# Patient Record
Sex: Female | Born: 1990 | Race: White | Hispanic: No | Marital: Single | Smoking: Never smoker
Health system: Southern US, Community
[De-identification: ages and names within clinical notes are randomized; demographics above are authoritative.]

## PROBLEM LIST (undated history)

## (undated) ENCOUNTER — Inpatient Hospital Stay (HOSPITAL_COMMUNITY): Payer: Self-pay

## (undated) DIAGNOSIS — J45909 Unspecified asthma, uncomplicated: Secondary | ICD-10-CM

## (undated) HISTORY — PX: PATENT DUCTUS ARTERIOUS REPAIR: SHX269

---
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
## 2015-04-16 LAB — OB RESULTS CONSOLE HIV ANTIBODY (ROUTINE TESTING): HIV: NONREACTIVE

## 2015-04-16 LAB — OB RESULTS CONSOLE HEPATITIS B SURFACE ANTIGEN: HEP B S AG: NEGATIVE

## 2015-04-16 LAB — OB RESULTS CONSOLE ANTIBODY SCREEN: Antibody Screen: NEGATIVE

## 2015-04-16 LAB — OB RESULTS CONSOLE GC/CHLAMYDIA
Chlamydia: NEGATIVE
Gonorrhea: NEGATIVE

## 2015-04-16 LAB — OB RESULTS CONSOLE RPR: RPR: NONREACTIVE

## 2015-04-16 LAB — OB RESULTS CONSOLE ABO/RH: RH TYPE: NEGATIVE

## 2015-04-16 LAB — OB RESULTS CONSOLE RUBELLA ANTIBODY, IGM: Rubella: IMMUNE

## 2015-05-30 ENCOUNTER — Other Ambulatory Visit (HOSPITAL_COMMUNITY): Payer: Self-pay | Admitting: Specialist

## 2015-05-30 DIAGNOSIS — O280 Abnormal hematological finding on antenatal screening of mother: Secondary | ICD-10-CM

## 2015-06-04 ENCOUNTER — Other Ambulatory Visit (HOSPITAL_COMMUNITY): Payer: Self-pay | Admitting: Specialist

## 2015-06-04 DIAGNOSIS — O280 Abnormal hematological finding on antenatal screening of mother: Secondary | ICD-10-CM

## 2015-06-05 ENCOUNTER — Other Ambulatory Visit (HOSPITAL_COMMUNITY): Payer: Self-pay | Admitting: Specialist

## 2015-06-05 ENCOUNTER — Ambulatory Visit (HOSPITAL_COMMUNITY)
Admission: RE | Admit: 2015-06-05 | Discharge: 2015-06-05 | Disposition: A | Discharge status: Home / Self Care | Payer: Medicaid Other | Source: Ambulatory Visit | Attending: Specialist | Admitting: Specialist

## 2015-06-05 ENCOUNTER — Encounter (HOSPITAL_COMMUNITY): Payer: Self-pay

## 2015-06-05 DIAGNOSIS — Z3A16 16 weeks gestation of pregnancy: Secondary | ICD-10-CM | POA: Insufficient documentation

## 2015-06-05 DIAGNOSIS — Z315 Encounter for genetic counseling: Secondary | ICD-10-CM | POA: Diagnosis present

## 2015-06-05 DIAGNOSIS — O283 Abnormal ultrasonic finding on antenatal screening of mother: Secondary | ICD-10-CM | POA: Insufficient documentation

## 2015-06-05 DIAGNOSIS — O280 Abnormal hematological finding on antenatal screening of mother: Secondary | ICD-10-CM | POA: Insufficient documentation

## 2015-06-05 NOTE — Progress Notes (Signed)
Genetic Counseling  High-Risk Gestation Note  Appointment Date:  06/05/2015 Referred By: Alm Bustardorn, Henry H, MD Date of Birth:  [birth year]   Pregnancy History: G1P0 Estimated Date of Delivery: 11/20/15 Estimated Gestational Age: 7549w0d Attending: Particia NearingMartha Decker, MD   Ms. Marcha SoldersBeverly R Territo was seen for consultation for genetic counseling because of an elevated MSAFP of 2.71 MoMs based on maternal serum screening through [facility].    In summary:   Elevated MSAFP (2.71 MoM) associated with 1 in 157 open spina bifida risk  Discussed various explanations for elevated MSAFP  Limited ultrasound today visualized normal early fetal anatomy  Follow-up ultrasound scheduled for 06/20/15; offer follow-up ultrasound in third trimester  Patient declined amniocentesis  We reviewed Ms. Favia's maternal serum screening result, the elevation of MSAFP, and the associated 1 in 157 risk for a fetal open neural tube defect.   We reviewed ONTDs, the typical multifactorial etiology, and variable prognosis.  In addition, we discussed additional explanations for an elevated MSAFP including: normal variation, twins, feto-maternal bleeding, a gestational dating error, abdominal wall defects, kidney differences, oligohydramnios, and placental problems.  We discussed that an unexplained elevation of MSAFP is associated with an increased risk for third trimester complications including: prematurity, low birth weight, and pre-eclampsia.    Ultrasound was performed at the time of today's visit. Visualized fetal anatomy appeared normal, but ultrasound was limited by early gestational age. Complete ultrasound results reported separately. Follow-up ultrasound is scheduled for 06/20/15 to complete fetal anatomic survey. She understands that ultrasound cannot rule out all birth defects or genetic syndromes.  However, she was counseled that 80-90% of fetuses with ONTDs can be detected by detailed 2nd trimester ultrasound,  when well visualized.  We reviewed additional available testing option of amniocentesis.  We discussed the risks, limitations, and benefits. We specifically discussed the associated 1 in 300-500 risk for complications from amniocentesis.  After thoughtful consideration of these options, Ms. Renae GlossShelton declined amniocentesis.       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 She reported that she was born full term or possibly a couple-few weeks early and had surgical correction at 9 months at [facility]. She is otherwise healthy. Congenital heart defects occur in approximately 1% of pregnancies.  Congenital heart defects may occur due to multifactorial influences, chromosomal abnormalities, genetic syndromes or environmental exposures.  Isolated heart defects are generally multifactorial.  Given the reported family history and assuming multifactorial inheritance, the risk for a congenital heart defect in the current pregnancy would be approximately 5-6%. We discussed the options of detailed ultrasound and fetal echocardiogram. The patient understands that prenatal ultrasound would not diagnose or rule out all congenital heart defects, including PDA during a pregnancy. Without further information regarding the provided family history, an accurate genetic risk cannot be calculated. Further genetic counseling is warranted if more information is obtained.  Ms. Renae GlossShelton denied exposure to environmental toxins or chemical agents. She denied the use of alcohol, tobacco or street drugs. She denied significant viral illnesses during the course of her pregnancy. 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   I counseled Ms. Marcha SoldersBeverly R Perrelli for approximately 35 minutes regarding the above risks and available options.    Quinn PlowmanKaren  Equilla Que, MS,  Certified Genetic Counselor 06/05/2015

## 2015-06-12 ENCOUNTER — Other Ambulatory Visit (HOSPITAL_COMMUNITY): Payer: Self-pay | Admitting: Specialist

## 2015-06-20 ENCOUNTER — Other Ambulatory Visit (HOSPITAL_COMMUNITY): Payer: Self-pay | Admitting: Specialist

## 2015-06-20 ENCOUNTER — Encounter (HOSPITAL_COMMUNITY): Payer: Self-pay

## 2015-06-20 ENCOUNTER — Ambulatory Visit (HOSPITAL_COMMUNITY)
Admission: RE | Admit: 2015-06-20 | Discharge: 2015-06-20 | Disposition: A | Discharge status: Home / Self Care | Payer: Medicaid Other | Source: Ambulatory Visit | Attending: Specialist | Admitting: Specialist

## 2015-06-20 DIAGNOSIS — O352XX0 Maternal care for (suspected) hereditary disease in fetus, not applicable or unspecified: Secondary | ICD-10-CM | POA: Diagnosis not present

## 2015-06-20 DIAGNOSIS — Z36 Encounter for antenatal screening of mother: Secondary | ICD-10-CM | POA: Diagnosis not present

## 2015-06-20 DIAGNOSIS — Z0375 Encounter for suspected cervical shortening ruled out: Secondary | ICD-10-CM | POA: Insufficient documentation

## 2015-06-20 DIAGNOSIS — O280 Abnormal hematological finding on antenatal screening of mother: Secondary | ICD-10-CM

## 2015-06-20 DIAGNOSIS — O283 Abnormal ultrasonic finding on antenatal screening of mother: Secondary | ICD-10-CM | POA: Insufficient documentation

## 2015-06-20 DIAGNOSIS — Z3689 Encounter for other specified antenatal screening: Secondary | ICD-10-CM | POA: Insufficient documentation

## 2015-06-20 DIAGNOSIS — Z3A18 18 weeks gestation of pregnancy: Secondary | ICD-10-CM | POA: Diagnosis not present

## 2015-08-26 ENCOUNTER — Emergency Department (HOSPITAL_COMMUNITY)
Admission: EM | Admit: 2015-08-26 | Discharge: 2015-08-26 | Disposition: A | Discharge status: Home / Self Care | Payer: Medicaid Other | Attending: Emergency Medicine | Admitting: Emergency Medicine

## 2015-08-26 ENCOUNTER — Encounter (HOSPITAL_COMMUNITY): Payer: Self-pay | Admitting: *Deleted

## 2015-08-26 DIAGNOSIS — B86 Scabies: Secondary | ICD-10-CM

## 2015-08-26 DIAGNOSIS — O98812 Other maternal infectious and parasitic diseases complicating pregnancy, second trimester: Secondary | ICD-10-CM | POA: Diagnosis not present

## 2015-08-26 DIAGNOSIS — O9A212 Injury, poisoning and certain other consequences of external causes complicating pregnancy, second trimester: Secondary | ICD-10-CM | POA: Diagnosis not present

## 2015-08-26 DIAGNOSIS — T148 Other injury of unspecified body region: Secondary | ICD-10-CM | POA: Insufficient documentation

## 2015-08-26 DIAGNOSIS — O9989 Other specified diseases and conditions complicating pregnancy, childbirth and the puerperium: Secondary | ICD-10-CM | POA: Diagnosis present

## 2015-08-26 DIAGNOSIS — Z3A28 28 weeks gestation of pregnancy: Secondary | ICD-10-CM | POA: Diagnosis not present

## 2015-08-26 MED ORDER — PERMETHRIN 5 % EX CREA: TOPICAL_CREAM | CUTANEOUS | Status: AC

## 2015-08-26 NOTE — Discharge Instructions (Signed)

## 2015-08-26 NOTE — ED Notes (Signed)
Pt here with itching rash which has been getting worse.  Significant other has same symptoms.  Pt is 7months pregnant

## 2015-08-26 NOTE — ED Provider Notes (Signed)
CSN: [id number]     Arrival date & time 08/26/15  1551 History  This chart was scribed for non-physician provider Roxy Horseman, PA-C, working with Laurence Spates, MD by Phillis Haggis, ED Scribe. This patient was seen in room [facility]WT/[facility] and patient care was started at 5:10 PM.   Chief Complaint  Patient presents with  . Rash   The history is provided by the patient. No language interpreter was used.   HPI Comments: Sierra Bailey is a 24 y.o. female who is 7 months pregnant who presents to the Emergency Department complaining of generalized itching rash onset 2 months ago. Pt states that the rash has recently appeared over her hands and wrists, but have been all over her stomach, buttocks, back and legs. She states that she has been using Gold Bond Powder on the areas to no relief. She and her boyfriend have previously been living with a lot of other people but have since moved to their own place. She denies any other symptoms.   History reviewed. No pertinent past medical history. History reviewed. No pertinent past surgical history. No family history on file. Social History  Substance Use Topics  . Smoking status: Never Smoker   . Smokeless tobacco: None  . Alcohol Use: No   OB History    Gravida Para Term Preterm AB TAB SAB Ectopic Multiple Living   1              Review of Systems  Constitutional: Negative for fever and chills.  Skin: Positive for rash. Negative for color change.   Allergies  Hemp seed oil  Home Medications   Prior to Admission medications   Medication Sig Start Date End Date Taking? Authorizing Provider  Prenatal Vit-Fe Fumarate-FA (PRENATAL MULTIVITAMIN) TABS tablet Take 1 tablet by mouth daily at 12 noon.   Yes Historical Provider, MD  AMOXICILLIN PO Take by mouth.    Historical Provider, MD   BP 108/72 mmHg  Pulse 95  Temp(Src) 98.6 F (37 C) (Oral)  Resp 14  Ht 5\' 10"  (1.778 m)  Wt 163 lb (73.936 kg)  BMI 23.39 kg/m2  SpO2 97%  LMP  02/13/2015  Physical Exam  Constitutional: She is oriented to person, place, and time. She appears well-developed and well-nourished. No distress.  HENT:  Head: Normocephalic and atraumatic.  Mouth/Throat: Oropharynx is clear and moist.  Eyes: Conjunctivae and EOM are normal.  Neck: Normal range of motion. Neck supple.  Cardiovascular: Normal rate, regular rhythm and normal heart sounds.   Pulmonary/Chest: Effort normal and breath sounds normal. No respiratory distress.  Musculoskeletal: Normal range of motion. She exhibits no edema.  Neurological: She is alert and oriented to person, place, and time. No sensory deficit.  Skin: Skin is warm and dry.  Scattered bug bites and excoriations consistent with scabies  Psychiatric: She has a normal mood and affect. Her behavior is normal.  Nursing note and vitals reviewed.   ED Course  Procedures (including critical care time) DIAGNOSTIC STUDIES: Oxygen Saturation is 97% on RA, normal by my interpretation.    COORDINATION OF CARE: 5:10 PM-Discussed treatment plan which includes Elimite cream with pt at bedside and pt agreed to plan.   Labs Review Labs Reviewed - No data to display  Imaging Review No results found.    EKG Interpretation None      MDM   Final diagnoses:  Scabies   Patient with scabies, will treat with permethrin, (pregnancy category B), return cautions given.  Patient is stable and ready for discharge. I personally performed the services described in this documentation, which was scribed in my presence. The recorded information has been reviewed and is accurate.    Roxy Horseman, PA-C 08/26/15 1727  Laurence Spates, MD 08/26/15 [phone]

## 2015-08-26 NOTE — ED Notes (Signed)
Spoke with [employer], he will see pt in triage

## 2015-11-11 ENCOUNTER — Encounter (HOSPITAL_COMMUNITY): Payer: Self-pay | Admitting: *Deleted

## 2015-11-11 ENCOUNTER — Inpatient Hospital Stay (HOSPITAL_COMMUNITY)
Admission: AD | Admit: 2015-11-11 | Discharge: 2015-11-11 | Disposition: A | Discharge status: Home / Self Care | Payer: Medicaid Other | Source: Ambulatory Visit | Attending: Family Medicine | Admitting: Family Medicine

## 2015-11-11 DIAGNOSIS — Z3A38 38 weeks gestation of pregnancy: Secondary | ICD-10-CM | POA: Diagnosis not present

## 2015-11-11 DIAGNOSIS — O368130 Decreased fetal movements, third trimester, not applicable or unspecified: Secondary | ICD-10-CM | POA: Diagnosis present

## 2015-11-11 HISTORY — DX: Unspecified asthma, uncomplicated: J45.909

## 2015-11-11 NOTE — Discharge Instructions (Signed)
Call your provider or seek care if:  You begin to have strong, frequent contractions  Your water breaks.  Sometimes it is a big gush of fluid, sometimes it is just a trickle that keeps getting your panties wet or running down your legs  You have vaginal bleeding.  It is normal to have a small amount of spotting if your cervix was checked.   You don't feel your baby moving like normal.  If you don't, get you something to eat and drink and lay down and focus on feeling your baby move.  You should feel at least 10 movements in 2 hours.  If you don't, you should call your provider or seek care.    Fetal Movement Counts Patient Name: __________________________________________________ Patient Due Date: ____________________ Performing a fetal movement count is highly recommended in high-risk pregnancies, but it is good for every pregnant woman to do. Your health care provider may ask you to start counting fetal movements at 28 weeks of the pregnancy. Fetal movements often increase: 6. After eating a full meal. 7. After physical activity. 8. After eating or drinking something sweet or cold. 9. At rest. Pay attention to when you feel the baby is most active. This will help you notice a pattern of your baby's sleep and wake cycles and what factors contribute to an increase in fetal movement. It is important to perform a fetal movement count at the same time each day when your baby is normally most active.  HOW TO COUNT FETAL MOVEMENTS 1. Find a quiet and comfortable area to sit or lie down on your left side. Lying on your left side provides the best blood and oxygen circulation to your baby. 2. Write down the day and time on a sheet of paper or in a journal. 3. Start counting kicks, flutters, swishes, rolls, or jabs in a 2-hour period. You should feel at least 10 movements within 2 hours. 4. If you do not feel 10 movements in 2 hours, wait 2-3 hours and count again. Look for a change in the pattern or  not enough counts in 2 hours. SEEK MEDICAL CARE IF:  You feel less than 10 counts in 2 hours, tried twice.  There is no movement in over an hour.  The pattern is changing or taking longer each day to reach 10 counts in 2 hours.  You feel the baby is not moving as he or she usually does. Date: ____________ Movements: ____________ Start time: ____________ Doreatha MartinFinish time: ____________  Date: ____________ Movements: ____________ Start time: ____________ Doreatha MartinFinish time: ____________ Date: ____________ Movements: ____________ Start time: ____________ Doreatha MartinFinish time: ____________ Date: ____________ Movements: ____________ Start time: ____________ Doreatha MartinFinish time: ____________ Date: ____________ Movements: ____________ Start time: ____________ Doreatha MartinFinish time: ____________ Date: ____________ Movements: ____________ Start time: ____________ Doreatha MartinFinish time: ____________ Date: ____________ Movements: ____________ Start time: ____________ Doreatha MartinFinish time: ____________ Date: ____________ Movements: ____________ Start time: ____________ Doreatha MartinFinish time: ____________  Date: ____________ Movements: ____________ Start time: ____________ Doreatha MartinFinish time: ____________ Date: ____________ Movements: ____________ Start time: ____________ Doreatha MartinFinish time: ____________ Date: ____________ Movements: ____________ Start time: ____________ Doreatha MartinFinish time: ____________ Date: ____________ Movements: ____________ Start time: ____________ Doreatha MartinFinish time: ____________ Date: ____________ Movements: ____________ Start time: ____________ Doreatha MartinFinish time: ____________ Date: ____________ Movements: ____________ Start time: ____________ Doreatha MartinFinish time: ____________ Date: ____________ Movements: ____________ Start time: ____________ Doreatha MartinFinish time: ____________  Date: ____________ Movements: ____________ Start time: ____________ Doreatha MartinFinish time: ____________ Date: ____________ Movements: ____________ Start time: ____________ Doreatha MartinFinish time: ____________ Date: ____________ Movements:  ____________ Start time: ____________  Finish time: ____________ Date: ____________ Movements: ____________ Start time: ____________ Doreatha Martin time: ____________ Date: ____________ Movements: ____________ Start time: ____________ Doreatha Martin time: ____________ Date: ____________ Movements: ____________ Start time: ____________ Doreatha Martin time: ____________ Date: ____________ Movements: ____________ Start time: ____________ Doreatha Martin time: ____________  Date: ____________ Movements: ____________ Start time: ____________ Doreatha Martin time: ____________ Date: ____________ Movements: ____________ Start time: ____________ Doreatha Martin time: ____________ Date: ____________ Movements: ____________ Start time: ____________ Doreatha Martin time: ____________ Date: ____________ Movements: ____________ Start time: ____________ Doreatha Martin time: ____________ Date: ____________ Movements: ____________ Start time: ____________ Doreatha Martin time: ____________ Date: ____________ Movements: ____________ Start time: ____________ Doreatha Martin time: ____________ Date: ____________ Movements: ____________ Start time: ____________ Doreatha Martin time: ____________  Date: ____________ Movements: ____________ Start time: ____________ Doreatha Martin time: ____________ Date: ____________ Movements: ____________ Start time: ____________ Doreatha Martin time: ____________ Date: ____________ Movements: ____________ Start time: ____________ Doreatha Martin time: ____________ Date: ____________ Movements: ____________ Start time: ____________ Doreatha Martin time: ____________ Date: ____________ Movements: ____________ Start time: ____________ Doreatha Martin time: ____________ Date: ____________ Movements: ____________ Start time: ____________ Doreatha Martin time: ____________ Date: ____________ Movements: ____________ Start time: ____________ Doreatha Martin time: ____________  Date: ____________ Movements: ____________ Start time: ____________ Doreatha Martin time: ____________ Date: ____________ Movements: ____________ Start time: ____________ Doreatha Martin  time: ____________ Date: ____________ Movements: ____________ Start time: ____________ Doreatha Martin time: ____________ Date: ____________ Movements: ____________ Start time: ____________ Doreatha Martin time: ____________ Date: ____________ Movements: ____________ Start time: ____________ Doreatha Martin time: ____________ Date: ____________ Movements: ____________ Start time: ____________ Doreatha Martin time: ____________ Date: ____________ Movements: ____________ Start time: ____________ Doreatha Martin time: ____________  Date: ____________ Movements: ____________ Start time: ____________ Doreatha Martin time: ____________ Date: ____________ Movements: ____________ Start time: ____________ Doreatha Martin time: ____________ Date: ____________ Movements: ____________ Start time: ____________ Doreatha Martin time: ____________ Date: ____________ Movements: ____________ Start time: ____________ Doreatha Martin time: ____________ Date: ____________ Movements: ____________ Start time: ____________ Doreatha Martin time: ____________ Date: ____________ Movements: ____________ Start time: ____________ Doreatha Martin time: ____________ Date: ____________ Movements: ____________ Start time: ____________ Doreatha Martin time: ____________  Date: ____________ Movements: ____________ Start time: ____________ Doreatha Martin time: ____________ Date: ____________ Movements: ____________ Start time: ____________ Doreatha Martin time: ____________ Date: ____________ Movements: ____________ Start time: ____________ Doreatha Martin time: ____________ Date: ____________ Movements: ____________ Start time: ____________ Doreatha Martin time: ____________ Date: ____________ Movements: ____________ Start time: ____________ Doreatha Martin time: ____________ Date: ____________ Movements: ____________ Start time: ____________ Doreatha Martin time: ____________   This information is not intended to replace advice given to you by your health care provider. Make sure you discuss any questions you have with your health care provider.   Document Released: 01/14/2007 Document  Revised: 01/05/2015 Document Reviewed: 10/11/2012 Elsevier Interactive Patient Education [employer]

## 2015-11-11 NOTE — MAU Note (Signed)
Pt stated she normally feels baby move all the time like every 5 min. Stated she felt a sharp pain in her pelvis and did not feel baaby move for about 30 min. Came strait to the hospital. ( pt gets care in [place] Dr. Shawnie Ponsorn). Denies SROM or Bleeding today. Had some white stringy mucus yesterday none today.

## 2015-11-11 NOTE — MAU Provider Note (Signed)
  History  Chief Complaint:  Decreased Fetal Movement  Sierra Bailey is a 24 y.o. G1P0 female at 6928w5d presenting w/ report of decreased fm x 30mins, states she usually feels baby move every 5 mins.  Reports she has felt normal movement since being on efm.   Reports decreased  fetal movement, contractions: none, vaginal bleeding: none, membranes: intact. Denies uti s/s, abnormal/malodorous vag d/c or vulvovaginal itching/irritation.   Prenatal care w/ Dr. Shawnie Ponsorn.  Next visit unsure- missed her visit on Friday and hasn't rescheduled yet. Pregnancy complicated by elevated AFP w/ OSBR 1:157.   Obstetrical History: OB History    Gravida Para Term Preterm AB TAB SAB Ectopic Multiple Living   1               Past Medical History: Past Medical History  Diagnosis Date  . Asthma     Past Surgical History: Past Surgical History  Procedure Laterality Date  . Patent ductus arterious repair      Social History: Social History   Social History  . Marital Status: Single    Spouse Name: N/A  . Number of Children: N/A  . Years of Education: N/A   Social History Main Topics  . Smoking status: Never Smoker   . Smokeless tobacco: None  . Alcohol Use: No  . Drug Use: No  . Sexual Activity: Not Asked   Other Topics Concern  . None   Social History Narrative    Allergies: Allergies  Allergen Reactions  . Hemp Seed Oil [Dronabinol]     Prescriptions prior to admission  Medication Sig Dispense Refill Last Dose  . AMOXICILLIN PO Take by mouth.     . permethrin (ELIMITE) 5 % cream Apply to entire body other than face - let sit for 14 hours then wash off, may repeat in 1 week if still having symptoms 60 g 1   . Prenatal Vit-Fe Fumarate-FA (PRENATAL MULTIVITAMIN) TABS tablet Take 1 tablet by mouth daily at 12 noon.   08/25/2015 at Unknown time    Review of Systems  Pertinent pos/neg as indicated in HPI  Physical Exam  Blood pressure 127/82, pulse 90, temperature 98 F (36.7 C),  temperature source Oral, resp. rate 18, height 5\' 10"  (1.778 m), weight 86.274 kg (190 lb 3.2 oz), last menstrual period 02/13/2015. General appearance: alert, cooperative and no distress Lungs: clear to auscultation bilaterally, normal effort Heart: regular rate and rhythm Abdomen: gravid, soft, non-tender  Spec exam: n/a Cultures/Specimens: n/a    Presentation: not examined  Fetal monitoring: FHR: 135 bpm, variability: moderate,  Accelerations: Present,  decelerations:  Absent Uterine activity: none  MAU Course  Reactive NST  Labs:  No results found for this or any previous visit (from the past 24 hour(s)).  Imaging:  n/a  Assessment and Plan  A:  2528w5d SIUP  G1P0  Decreased fm, now normal w/ reactive NST  Cat 1 FHR P:  D/C home  Reviewed labor s/s, fkc  Call Monday to get next appt scheduled w/ Dr. Shawnie Ponsorn  Recommended that she go to the hospital where her provider practices for any further complications   Marge DuncansBooker, Kimberly Randall CNM,WHNP-BC 11/13/201612:49 AM

## 2016-02-01 ENCOUNTER — Encounter (HOSPITAL_COMMUNITY): Payer: Self-pay

## 2016-02-01 ENCOUNTER — Emergency Department (HOSPITAL_COMMUNITY)
Admission: EM | Admit: 2016-02-01 | Discharge: 2016-02-01 | Disposition: A | Discharge status: Home / Self Care | Payer: Medicaid Other | Attending: Emergency Medicine | Admitting: Emergency Medicine

## 2016-02-01 ENCOUNTER — Emergency Department (HOSPITAL_COMMUNITY): Payer: Medicaid Other

## 2016-02-01 DIAGNOSIS — D72829 Elevated white blood cell count, unspecified: Secondary | ICD-10-CM | POA: Insufficient documentation

## 2016-02-01 DIAGNOSIS — N61 Mastitis without abscess: Secondary | ICD-10-CM | POA: Insufficient documentation

## 2016-02-01 DIAGNOSIS — R000 Tachycardia, unspecified: Secondary | ICD-10-CM | POA: Diagnosis not present

## 2016-02-01 DIAGNOSIS — J45909 Unspecified asthma, uncomplicated: Secondary | ICD-10-CM | POA: Insufficient documentation

## 2016-02-01 DIAGNOSIS — N644 Mastodynia: Secondary | ICD-10-CM | POA: Diagnosis present

## 2016-02-01 DIAGNOSIS — O9123 Nonpurulent mastitis associated with lactation: Secondary | ICD-10-CM

## 2016-02-01 LAB — COMPREHENSIVE METABOLIC PANEL
ALK PHOS: 72 U/L (ref 38–126)
ALT: 57 U/L — ABNORMAL HIGH (ref 14–54)
ANION GAP: 13 (ref 5–15)
AST: 36 U/L (ref 15–41)
Albumin: 4.2 g/dL (ref 3.5–5.0)
BILIRUBIN TOTAL: 0.6 mg/dL (ref 0.3–1.2)
BUN: 6 mg/dL (ref 6–20)
CALCIUM: 9.9 mg/dL (ref 8.9–10.3)
CO2: 24 mmol/L (ref 22–32)
CREATININE: 1.15 mg/dL — AB (ref 0.44–1.00)
Chloride: 103 mmol/L (ref 101–111)
Glucose, Bld: 114 mg/dL — ABNORMAL HIGH (ref 65–99)
Potassium: 4.2 mmol/L (ref 3.5–5.1)
Sodium: 140 mmol/L (ref 135–145)
TOTAL PROTEIN: 7.2 g/dL (ref 6.5–8.1)

## 2016-02-01 LAB — URINALYSIS, ROUTINE W REFLEX MICROSCOPIC
BILIRUBIN URINE: NEGATIVE
Glucose, UA: NEGATIVE mg/dL
Ketones, ur: NEGATIVE mg/dL
Leukocytes, UA: NEGATIVE
NITRITE: NEGATIVE
PH: 6.5 (ref 5.0–8.0)
Protein, ur: NEGATIVE mg/dL
Specific Gravity, Urine: 1.008 (ref 1.005–1.030)

## 2016-02-01 LAB — CBC WITH DIFFERENTIAL/PLATELET
Basophils Absolute: 0 10*3/uL (ref 0.0–0.1)
Basophils Relative: 0 %
Eosinophils Absolute: 0 10*3/uL (ref 0.0–0.7)
Eosinophils Relative: 0 %
HEMATOCRIT: 42.9 % (ref 36.0–46.0)
Hemoglobin: 15.1 g/dL — ABNORMAL HIGH (ref 12.0–15.0)
LYMPHS ABS: 1.2 10*3/uL (ref 0.7–4.0)
Lymphocytes Relative: 9 %
MCH: 31.6 pg (ref 26.0–34.0)
MCHC: 35.2 g/dL (ref 30.0–36.0)
MCV: 89.7 fL (ref 78.0–100.0)
MONOS PCT: 4 %
Monocytes Absolute: 0.5 10*3/uL (ref 0.1–1.0)
NEUTROS ABS: 11.4 10*3/uL — AB (ref 1.7–7.7)
Neutrophils Relative %: 87 %
Platelets: 161 10*3/uL (ref 150–400)
RBC: 4.78 MIL/uL (ref 3.87–5.11)
RDW: 13.1 % (ref 11.5–15.5)
WBC: 13.1 10*3/uL — AB (ref 4.0–10.5)

## 2016-02-01 LAB — URINE MICROSCOPIC-ADD ON: BACTERIA UA: NONE SEEN

## 2016-02-01 LAB — I-STAT CG4 LACTIC ACID, ED: Lactic Acid, Venous: 1.32 mmol/L (ref 0.5–2.0)

## 2016-02-01 MED ORDER — SULFAMETHOXAZOLE-TRIMETHOPRIM 800-160 MG PO TABS: 1.0000 | ORAL_TABLET | Freq: Two times a day (BID) | ORAL | Status: AC

## 2016-02-01 MED ORDER — ACETAMINOPHEN 325 MG PO TABS
ORAL_TABLET | ORAL | Status: AC
  Filled 2016-02-01: qty 3

## 2016-02-01 MED ORDER — SULFAMETHOXAZOLE-TRIMETHOPRIM 800-160 MG PO TABS
1.0000 | ORAL_TABLET | Freq: Once | ORAL | Status: AC
  Administered 2016-02-01: 1 via ORAL
  Filled 2016-02-01: qty 1

## 2016-02-01 MED ORDER — ACETAMINOPHEN 500 MG PO TABS
1000.0000 mg | ORAL_TABLET | Freq: Once | ORAL | Status: AC
  Administered 2016-02-01: 975 mg via ORAL

## 2016-02-01 NOTE — ED Provider Notes (Signed)
CSN: [id number]     Arrival date & time 02/01/16  1436 History   First MD Initiated Contact with Patient 02/01/16 1718     Chief Complaint  Patient presents with  . Breast Pain     (Consider location/radiation/quality/duration/timing/severity/associated sxs/prior Treatment) Patient is a 25 y.o. female presenting with general illness. The history is provided by the patient.  Illness Location:  Right breast pain  Quality:  Aching Severity:  Moderate Onset quality:  Gradual Duration:  3 days Timing:  Constant Progression:  Worsening Chronicity:  New Context:  Fever last night Relieved by:  Tylenol Worsened by:  Palpation Ineffective treatments:  N/a Associated symptoms: fever   Associated symptoms: no abdominal pain, no chest pain, no congestion, no cough, no diarrhea, no loss of consciousness, no nausea, no shortness of breath and no vomiting     Past Medical History  Diagnosis Date  . Asthma    Past Surgical History  Procedure Laterality Date  . Patent ductus arterious repair     History reviewed. No pertinent family history. Social History  Substance Use Topics  . Smoking status: Never Smoker   . Smokeless tobacco: None  . Alcohol Use: No   OB History    Gravida Para Term Preterm AB TAB SAB Ectopic Multiple Living   1              Review of Systems  Constitutional: Positive for fever.  HENT: Negative for congestion.   Eyes: Negative for visual disturbance.  Respiratory: Negative for cough and shortness of breath.   Cardiovascular: Negative for chest pain.  Gastrointestinal: Negative for nausea, vomiting, abdominal pain and diarrhea.  Genitourinary: Negative.   Musculoskeletal: Negative.   Skin: Negative.   Neurological: Negative.  Negative for loss of consciousness.      Allergies  Hemp seed oil  Home Medications   Prior to Admission medications   Medication Sig Start Date End Date Taking? Authorizing Provider  permethrin (ELIMITE) 5 % cream Apply  to entire body other than face - let sit for 14 hours then wash off, may repeat in 1 week if still having symptoms 08/26/15  Yes Roxy Horseman, PA-C  MIRENA, 52 MG, 20 MCG/24HR IUD TO BE INSERTED ONE TIME BY PRESCRIBER. ROUTE INTRAUTERINE. 01/02/16   Historical Provider, MD  sulfamethoxazole-trimethoprim (BACTRIM DS,SEPTRA DS) 800-160 MG tablet Take 1 tablet by mouth 2 (two) times daily. 02/01/16 02/08/16  Blane Ohara, MD   BP 95/61 mmHg  Pulse 95  Temp(Src) 99 F (37.2 C) (Oral)  Resp 16  SpO2 98%  LMP 01/21/2016 (Approximate)  Breastfeeding? Yes Physical Exam  Constitutional: She is oriented to person, place, and time. She appears well-developed and well-nourished. No distress.  HENT:  Head: Normocephalic and atraumatic.  Eyes: EOM are normal. Pupils are equal, round, and reactive to light. No scleral icterus.  Neck: Normal range of motion. Neck supple.  Cardiovascular: Regular rhythm, normal heart sounds and intact distal pulses.   tachycardia  Pulmonary/Chest: Effort normal and breath sounds normal. She exhibits tenderness (right breast) and swelling. She exhibits no laceration and no deformity. Right breast exhibits skin change (with some spreading erythema from nipple) and tenderness. Right breast exhibits no inverted nipple.  Abdominal: Soft. Bowel sounds are normal. She exhibits no distension. There is no tenderness. There is no rebound and no guarding.  Musculoskeletal: Normal range of motion. She exhibits no edema or tenderness.  Neurological: She is alert and oriented to person, place, and time. No cranial nerve  deficit. She exhibits normal muscle tone. Coordination normal.  Skin: Skin is warm. No rash noted. She is not diaphoretic. No erythema. No pallor.  Psychiatric: She has a normal mood and affect.  Nursing note and vitals reviewed.   ED Course  Procedures (including critical care time) Labs Review Labs Reviewed  COMPREHENSIVE METABOLIC PANEL - Abnormal; Notable for the  following:    Glucose, Bld 114 (*)    Creatinine, Ser 1.15 (*)    ALT 57 (*)    All other components within normal limits  URINALYSIS, ROUTINE W REFLEX MICROSCOPIC (NOT AT [facility]) - Abnormal; Notable for the following:    APPearance CLOUDY (*)    Hgb urine dipstick MODERATE (*)    All other components within normal limits  CBC WITH DIFFERENTIAL/PLATELET - Abnormal; Notable for the following:    WBC 13.1 (*)    Hemoglobin 15.1 (*)    Neutro Abs 11.4 (*)    All other components within normal limits  URINE MICROSCOPIC-ADD ON - Abnormal; Notable for the following:    Squamous Epithelial / LPF 0-5 (*)    All other components within normal limits  CULTURE, BLOOD (ROUTINE X 2)  CULTURE, BLOOD (ROUTINE X 2)  URINE CULTURE  I-STAT CG4 LACTIC ACID, ED    Imaging Review Dg Chest 2 View  02/01/2016  CLINICAL DATA:  Right breast swelling and redness for several days. fever headache. Mild asthma history. EXAM: CHEST  2 VIEW COMPARISON:  None available FINDINGS: Normal heart size and vascularity. Lungs remain clear. No focal pneumonia, collapse or consolidation. No edema, effusion or pneumothorax. Trachea midline. No soft tissue abnormality or acute osseous finding. Chronic diffuse deformity of the left posterior fifth rib. IMPRESSION: No active cardiopulmonary disease. Electronically Signed   By: Judie Petit.  Shick M.D.   On: 02/01/2016 15:37   I have personally reviewed and evaluated these images and lab results as part of my medical decision-making.   EKG Interpretation None      MDM   Final diagnoses:  Nonpurulent mastitis associated with lactation    Patient is a 25 year old female 2 months postpartum who presents with worsening right breast pain for 2-3 nights and last night developed a fever to 102. She has been breast-feeding regularly. Further history and exam as above notable for slight tachycardia and temperature 102.7. On breast exam her breast is swollen and tender with erythema. Heart rate  and temperature improved with treatment here. Patient has a mild leukocytosis but otherwise labs are reassuring. Concern for lactation mastitis at this time. Doubt significant systemic infection. Patient given a dose of Bactrim here with concerns of possible MRSA.   I have reviewed all labs. Patient stable for discharge home.  I have reviewed all results with the patient. Advised to cont to breast or pump and waste. Will rx bactrim. Patient agrees to stated plan. All questions answered. Advised to call or return to have any questions, new symptoms, change in symptoms, or symptoms that they do not understand.     Marijean Niemann, MD 02/02/16 1046  Blane Ohara, MD 02/02/16 [phone]

## 2016-02-01 NOTE — Discharge Instructions (Signed)
Take antibiotics as directed. Take Tylenol every 4 hours for pain and fever. Continue to either pump or breast-feed and follow closely with your OB doctor or primary doctor. If your symptoms worsen, uncontrolled fevers, pass out or other concerns please come to the emergency room or call your doctor.  If you were given medicines take as directed.  If you are on coumadin or contraceptives realize their levels and effectiveness is altered by many different medicines.  If you have any reaction (rash, tongues swelling, other) to the medicines stop taking and see a physician.    If your blood pressure was elevated in the ER make sure you follow up for management with a primary doctor or return for chest pain, shortness of breath or stroke symptoms.  Please follow up as directed and return to the ER or see a physician for new or worsening symptoms.  Thank you. Filed Vitals:   02/01/16 1749 02/01/16 1800 02/01/16 1807 02/01/16 1832  BP:  107/68 107/68 95/61  Pulse:  102 99 95  Temp: 99 F (37.2 C)     TempSrc: Oral     Resp:   16   SpO2:  98% 97% 98%    Mastitis  Mastitis is redness, soreness, and puffiness (inflammation) in an area of the breast. It is often caused by an infection that occurs when bacteria enter the skin. The infection is often helped by antibiotic medicine. HOME CARE  Only take medicines as told by your doctor.  If your doctor prescribed an antibiotic medicine, take it as told. Finish it even if you start to feel better.  Do not wear a tight or underwire bra. Wear a soft support bra.  Drink more fluids, especially if you have a fever.  If you are breastfeeding:  Keep emptying the breast. Your doctor can tell you if the milk is safe. Use a breast pump if you are told to stop nursing.  Keep your nipples clean and dry.  Empty the first breast before going to the other breast. Use a breast pump if your baby is not emptying your breast.  If you go back to work, pump  your breasts while at work.  Avoid letting your breasts get overly filled with milk (engorged). GET HELP IF:  You have pus-like fluid leaking from your breast.  Your symptoms do not get better within 2 days. GET HELP RIGHT AWAY IF:  Your pain and puffiness are getting worse.  Your pain is not helped by medicine.  You have a red line going from your breast toward your armpit.  You have a fever or lasting symptoms for more than 2-3 days.  You have a fever and your symptoms suddenly get worse. MAKE SURE YOU:   Understand these instructions.  Will watch your condition.  Will get help right away if you are not doing well or get worse.   This information is not intended to replace advice given to you by your health care provider. Make sure you discuss any questions you have with your health care provider.   Document Released: 12/03/2009 Document Revised: 08/17/2013 Document Reviewed: 07/15/2013 Elsevier Interactive Patient Education [employer]

## 2016-02-01 NOTE — ED Notes (Addendum)
Pt presents with 2-3 day h/o bilateral breast pain.  Pt is 2 months post-partum, reports pain to R breast for a few days that became red and inflamed today.  Pt reports pain up to her R axilla.  Pt reports fever of 102 PTA.  Pt reports umbilical pain that feels "like gas", reports marana implanted x 1 week ago, denies any vaginal discharge, denies any dysuria.

## 2016-02-01 NOTE — ED Notes (Signed)
Pt. Given crackers and peanut butter. 

## 2016-02-01 NOTE — ED Notes (Addendum)
Pt ambulated to restroom. 

## 2016-02-05 ENCOUNTER — Telehealth (HOSPITAL_COMMUNITY): Payer: Self-pay

## 2016-02-06 ENCOUNTER — Telehealth (HOSPITAL_COMMUNITY): Payer: Self-pay

## 2016-02-06 LAB — CULTURE, BLOOD (ROUTINE X 2)
Culture: NO GROWTH
Culture: NO GROWTH

## 2016-02-06 NOTE — Telephone Encounter (Signed)
Post ED Visit - Positive Culture Follow-up    Positive stool culture, Salmonella Species Treated with Bactrim DS, organism sensitive to the same and no further patient follow-up is required at this time.  Chart reviewed by L. Sanders PA "Antibiotics sensitive.  Continue Bactrim as prescribed"     02/06/2016 @ 10:22 LVM requesting callback.  Letter sent to [facility] address.  Arvid Right 02/06/2016, 10:24 AM

## 2016-02-09 ENCOUNTER — Telehealth: Payer: Self-pay | Admitting: Emergency Medicine

## 2016-02-29 LAB — URINE CULTURE
